# Patient Record
Sex: Male | Born: 1960 | Race: White | Hispanic: No | Smoking: Current every day smoker
Health system: Southern US, Community
[De-identification: ages and names within clinical notes are randomized; demographics above are authoritative.]

## PROBLEM LIST (undated history)

## (undated) DIAGNOSIS — E119 Type 2 diabetes mellitus without complications: Secondary | ICD-10-CM

## (undated) DIAGNOSIS — C8590 Non-Hodgkin lymphoma, unspecified, unspecified site: Secondary | ICD-10-CM

## (undated) DIAGNOSIS — I739 Peripheral vascular disease, unspecified: Secondary | ICD-10-CM

## (undated) DIAGNOSIS — I10 Essential (primary) hypertension: Secondary | ICD-10-CM

## (undated) HISTORY — PX: PORTACATH PLACEMENT: SHX2246

## (undated) HISTORY — PX: TOE SURGERY: SHX1073

## (undated) HISTORY — PX: COLONOSCOPY: SHX174

## (undated) HISTORY — PX: OTHER SURGICAL HISTORY: SHX169

## (undated) HISTORY — PX: TOTAL HIP ARTHROPLASTY: SHX124

---

## 1997-05-03 DIAGNOSIS — C8590 Non-Hodgkin lymphoma, unspecified, unspecified site: Secondary | ICD-10-CM

## 1997-05-03 HISTORY — DX: Non-Hodgkin lymphoma, unspecified, unspecified site: C85.90

## 2004-09-01 ENCOUNTER — Ambulatory Visit: Payer: Self-pay | Admitting: Hematology

## 2004-11-24 ENCOUNTER — Ambulatory Visit: Payer: Self-pay | Admitting: Hematology

## 2006-05-23 ENCOUNTER — Ambulatory Visit: Payer: Self-pay | Admitting: Oncology

## 2006-11-17 ENCOUNTER — Ambulatory Visit: Payer: Self-pay | Admitting: Oncology

## 2014-05-07 DIAGNOSIS — I10 Essential (primary) hypertension: Secondary | ICD-10-CM | POA: Diagnosis not present

## 2014-05-07 DIAGNOSIS — E1129 Type 2 diabetes mellitus with other diabetic kidney complication: Secondary | ICD-10-CM | POA: Diagnosis not present

## 2014-05-07 DIAGNOSIS — E1165 Type 2 diabetes mellitus with hyperglycemia: Secondary | ICD-10-CM | POA: Diagnosis not present

## 2014-05-07 DIAGNOSIS — Z6834 Body mass index (BMI) 34.0-34.9, adult: Secondary | ICD-10-CM | POA: Diagnosis not present

## 2014-06-06 DIAGNOSIS — E1165 Type 2 diabetes mellitus with hyperglycemia: Secondary | ICD-10-CM | POA: Diagnosis not present

## 2014-06-06 DIAGNOSIS — E1129 Type 2 diabetes mellitus with other diabetic kidney complication: Secondary | ICD-10-CM | POA: Diagnosis not present

## 2014-06-17 DIAGNOSIS — Z6833 Body mass index (BMI) 33.0-33.9, adult: Secondary | ICD-10-CM | POA: Diagnosis not present

## 2014-06-17 DIAGNOSIS — F17200 Nicotine dependence, unspecified, uncomplicated: Secondary | ICD-10-CM | POA: Diagnosis not present

## 2014-06-17 DIAGNOSIS — R220 Localized swelling, mass and lump, head: Secondary | ICD-10-CM | POA: Diagnosis not present

## 2014-06-17 DIAGNOSIS — Z8572 Personal history of non-Hodgkin lymphomas: Secondary | ICD-10-CM | POA: Diagnosis not present

## 2014-07-04 DIAGNOSIS — E782 Mixed hyperlipidemia: Secondary | ICD-10-CM | POA: Diagnosis not present

## 2014-07-04 DIAGNOSIS — I10 Essential (primary) hypertension: Secondary | ICD-10-CM | POA: Diagnosis not present

## 2014-07-04 DIAGNOSIS — E1129 Type 2 diabetes mellitus with other diabetic kidney complication: Secondary | ICD-10-CM | POA: Diagnosis not present

## 2014-07-05 DIAGNOSIS — D649 Anemia, unspecified: Secondary | ICD-10-CM | POA: Diagnosis not present

## 2014-07-05 DIAGNOSIS — C8585 Other specified types of non-Hodgkin lymphoma, lymph nodes of inguinal region and lower limb: Secondary | ICD-10-CM | POA: Diagnosis not present

## 2014-07-11 DIAGNOSIS — E1169 Type 2 diabetes mellitus with other specified complication: Secondary | ICD-10-CM | POA: Diagnosis not present

## 2014-07-11 DIAGNOSIS — E1365 Other specified diabetes mellitus with hyperglycemia: Secondary | ICD-10-CM | POA: Diagnosis not present

## 2014-07-11 DIAGNOSIS — E1329 Other specified diabetes mellitus with other diabetic kidney complication: Secondary | ICD-10-CM | POA: Diagnosis not present

## 2014-07-11 DIAGNOSIS — I10 Essential (primary) hypertension: Secondary | ICD-10-CM | POA: Diagnosis not present

## 2014-07-15 DIAGNOSIS — K08129 Complete loss of teeth due to periodontal diseases, unspecified class: Secondary | ICD-10-CM | POA: Diagnosis not present

## 2014-07-15 DIAGNOSIS — Z8572 Personal history of non-Hodgkin lymphomas: Secondary | ICD-10-CM | POA: Diagnosis not present

## 2014-07-15 DIAGNOSIS — K056 Periodontal disease, unspecified: Secondary | ICD-10-CM | POA: Diagnosis not present

## 2014-08-05 DIAGNOSIS — C8205 Follicular lymphoma grade I, lymph nodes of inguinal region and lower limb: Secondary | ICD-10-CM | POA: Diagnosis not present

## 2014-08-05 DIAGNOSIS — R911 Solitary pulmonary nodule: Secondary | ICD-10-CM | POA: Diagnosis not present

## 2014-11-05 DIAGNOSIS — E782 Mixed hyperlipidemia: Secondary | ICD-10-CM | POA: Diagnosis not present

## 2014-11-05 DIAGNOSIS — E1165 Type 2 diabetes mellitus with hyperglycemia: Secondary | ICD-10-CM | POA: Diagnosis not present

## 2014-11-05 DIAGNOSIS — I10 Essential (primary) hypertension: Secondary | ICD-10-CM | POA: Diagnosis not present

## 2014-11-05 DIAGNOSIS — E1129 Type 2 diabetes mellitus with other diabetic kidney complication: Secondary | ICD-10-CM | POA: Diagnosis not present

## 2014-11-18 DIAGNOSIS — I10 Essential (primary) hypertension: Secondary | ICD-10-CM | POA: Diagnosis not present

## 2014-11-18 DIAGNOSIS — E1159 Type 2 diabetes mellitus with other circulatory complications: Secondary | ICD-10-CM | POA: Diagnosis not present

## 2014-11-18 DIAGNOSIS — E1165 Type 2 diabetes mellitus with hyperglycemia: Secondary | ICD-10-CM | POA: Diagnosis not present

## 2014-11-18 DIAGNOSIS — E1129 Type 2 diabetes mellitus with other diabetic kidney complication: Secondary | ICD-10-CM | POA: Diagnosis not present

## 2014-12-03 DIAGNOSIS — F329 Major depressive disorder, single episode, unspecified: Secondary | ICD-10-CM | POA: Diagnosis not present

## 2014-12-03 DIAGNOSIS — E1159 Type 2 diabetes mellitus with other circulatory complications: Secondary | ICD-10-CM | POA: Diagnosis not present

## 2014-12-03 DIAGNOSIS — I10 Essential (primary) hypertension: Secondary | ICD-10-CM | POA: Diagnosis not present

## 2014-12-03 DIAGNOSIS — R41840 Attention and concentration deficit: Secondary | ICD-10-CM | POA: Diagnosis not present

## 2014-12-05 DIAGNOSIS — Z95820 Peripheral vascular angioplasty status with implants and grafts: Secondary | ICD-10-CM | POA: Diagnosis not present

## 2014-12-05 DIAGNOSIS — I70202 Unspecified atherosclerosis of native arteries of extremities, left leg: Secondary | ICD-10-CM | POA: Diagnosis not present

## 2014-12-05 DIAGNOSIS — I700 Atherosclerosis of aorta: Secondary | ICD-10-CM | POA: Diagnosis not present

## 2014-12-17 DIAGNOSIS — I10 Essential (primary) hypertension: Secondary | ICD-10-CM | POA: Diagnosis not present

## 2014-12-17 DIAGNOSIS — E1159 Type 2 diabetes mellitus with other circulatory complications: Secondary | ICD-10-CM | POA: Diagnosis not present

## 2014-12-17 DIAGNOSIS — Z6832 Body mass index (BMI) 32.0-32.9, adult: Secondary | ICD-10-CM | POA: Diagnosis not present

## 2014-12-31 DIAGNOSIS — I10 Essential (primary) hypertension: Secondary | ICD-10-CM | POA: Diagnosis not present

## 2014-12-31 DIAGNOSIS — E1159 Type 2 diabetes mellitus with other circulatory complications: Secondary | ICD-10-CM | POA: Diagnosis not present

## 2015-01-01 DIAGNOSIS — E1159 Type 2 diabetes mellitus with other circulatory complications: Secondary | ICD-10-CM | POA: Diagnosis not present

## 2015-01-07 DIAGNOSIS — F17210 Nicotine dependence, cigarettes, uncomplicated: Secondary | ICD-10-CM | POA: Diagnosis not present

## 2015-01-07 DIAGNOSIS — E1151 Type 2 diabetes mellitus with diabetic peripheral angiopathy without gangrene: Secondary | ICD-10-CM | POA: Diagnosis not present

## 2015-01-07 DIAGNOSIS — I70203 Unspecified atherosclerosis of native arteries of extremities, bilateral legs: Secondary | ICD-10-CM | POA: Diagnosis not present

## 2015-01-07 DIAGNOSIS — I10 Essential (primary) hypertension: Secondary | ICD-10-CM | POA: Diagnosis not present

## 2015-01-08 DIAGNOSIS — I10 Essential (primary) hypertension: Secondary | ICD-10-CM | POA: Diagnosis not present

## 2015-01-08 DIAGNOSIS — E1159 Type 2 diabetes mellitus with other circulatory complications: Secondary | ICD-10-CM | POA: Diagnosis not present

## 2015-01-08 DIAGNOSIS — E1165 Type 2 diabetes mellitus with hyperglycemia: Secondary | ICD-10-CM | POA: Diagnosis not present

## 2015-02-06 DIAGNOSIS — E1165 Type 2 diabetes mellitus with hyperglycemia: Secondary | ICD-10-CM | POA: Diagnosis not present

## 2015-02-06 DIAGNOSIS — E1129 Type 2 diabetes mellitus with other diabetic kidney complication: Secondary | ICD-10-CM | POA: Diagnosis not present

## 2015-02-17 DIAGNOSIS — E785 Hyperlipidemia, unspecified: Secondary | ICD-10-CM | POA: Diagnosis not present

## 2015-02-17 DIAGNOSIS — I10 Essential (primary) hypertension: Secondary | ICD-10-CM | POA: Diagnosis not present

## 2015-02-17 DIAGNOSIS — Z23 Encounter for immunization: Secondary | ICD-10-CM | POA: Diagnosis not present

## 2015-02-17 DIAGNOSIS — E1140 Type 2 diabetes mellitus with diabetic neuropathy, unspecified: Secondary | ICD-10-CM | POA: Diagnosis not present

## 2015-02-17 DIAGNOSIS — E1165 Type 2 diabetes mellitus with hyperglycemia: Secondary | ICD-10-CM | POA: Diagnosis not present

## 2015-03-10 DIAGNOSIS — E1129 Type 2 diabetes mellitus with other diabetic kidney complication: Secondary | ICD-10-CM | POA: Diagnosis not present

## 2015-03-10 DIAGNOSIS — E1165 Type 2 diabetes mellitus with hyperglycemia: Secondary | ICD-10-CM | POA: Diagnosis not present

## 2015-03-10 DIAGNOSIS — Z6835 Body mass index (BMI) 35.0-35.9, adult: Secondary | ICD-10-CM | POA: Diagnosis not present

## 2015-06-18 DIAGNOSIS — E1129 Type 2 diabetes mellitus with other diabetic kidney complication: Secondary | ICD-10-CM | POA: Diagnosis not present

## 2015-06-18 DIAGNOSIS — E1159 Type 2 diabetes mellitus with other circulatory complications: Secondary | ICD-10-CM | POA: Diagnosis not present

## 2015-06-18 DIAGNOSIS — E1169 Type 2 diabetes mellitus with other specified complication: Secondary | ICD-10-CM | POA: Diagnosis not present

## 2015-06-18 DIAGNOSIS — E1165 Type 2 diabetes mellitus with hyperglycemia: Secondary | ICD-10-CM | POA: Diagnosis not present

## 2015-06-23 DIAGNOSIS — E1165 Type 2 diabetes mellitus with hyperglycemia: Secondary | ICD-10-CM | POA: Diagnosis not present

## 2015-06-23 DIAGNOSIS — E782 Mixed hyperlipidemia: Secondary | ICD-10-CM | POA: Diagnosis not present

## 2015-06-23 DIAGNOSIS — E1129 Type 2 diabetes mellitus with other diabetic kidney complication: Secondary | ICD-10-CM | POA: Diagnosis not present

## 2015-06-23 DIAGNOSIS — E1159 Type 2 diabetes mellitus with other circulatory complications: Secondary | ICD-10-CM | POA: Diagnosis not present

## 2015-07-24 DIAGNOSIS — Z1389 Encounter for screening for other disorder: Secondary | ICD-10-CM | POA: Diagnosis not present

## 2015-07-24 DIAGNOSIS — Z139 Encounter for screening, unspecified: Secondary | ICD-10-CM | POA: Diagnosis not present

## 2015-07-24 DIAGNOSIS — Z0000 Encounter for general adult medical examination without abnormal findings: Secondary | ICD-10-CM | POA: Diagnosis not present

## 2015-07-24 DIAGNOSIS — Z9181 History of falling: Secondary | ICD-10-CM | POA: Diagnosis not present

## 2015-09-11 DIAGNOSIS — H3561 Retinal hemorrhage, right eye: Secondary | ICD-10-CM | POA: Diagnosis not present

## 2015-09-11 DIAGNOSIS — E119 Type 2 diabetes mellitus without complications: Secondary | ICD-10-CM | POA: Diagnosis not present

## 2015-09-23 DIAGNOSIS — E1159 Type 2 diabetes mellitus with other circulatory complications: Secondary | ICD-10-CM | POA: Diagnosis not present

## 2015-09-23 DIAGNOSIS — E1129 Type 2 diabetes mellitus with other diabetic kidney complication: Secondary | ICD-10-CM | POA: Diagnosis not present

## 2015-09-23 DIAGNOSIS — E1165 Type 2 diabetes mellitus with hyperglycemia: Secondary | ICD-10-CM | POA: Diagnosis not present

## 2015-09-23 DIAGNOSIS — E782 Mixed hyperlipidemia: Secondary | ICD-10-CM | POA: Diagnosis not present

## 2015-09-30 DIAGNOSIS — E782 Mixed hyperlipidemia: Secondary | ICD-10-CM | POA: Diagnosis not present

## 2015-09-30 DIAGNOSIS — E875 Hyperkalemia: Secondary | ICD-10-CM | POA: Diagnosis not present

## 2015-09-30 DIAGNOSIS — E1129 Type 2 diabetes mellitus with other diabetic kidney complication: Secondary | ICD-10-CM | POA: Diagnosis not present

## 2015-09-30 DIAGNOSIS — E1159 Type 2 diabetes mellitus with other circulatory complications: Secondary | ICD-10-CM | POA: Diagnosis not present

## 2015-09-30 DIAGNOSIS — E1165 Type 2 diabetes mellitus with hyperglycemia: Secondary | ICD-10-CM | POA: Diagnosis not present

## 2015-09-30 DIAGNOSIS — I10 Essential (primary) hypertension: Secondary | ICD-10-CM | POA: Diagnosis not present

## 2015-10-02 DIAGNOSIS — E113312 Type 2 diabetes mellitus with moderate nonproliferative diabetic retinopathy with macular edema, left eye: Secondary | ICD-10-CM | POA: Diagnosis not present

## 2015-10-18 DIAGNOSIS — I10 Essential (primary) hypertension: Secondary | ICD-10-CM | POA: Diagnosis not present

## 2015-10-18 DIAGNOSIS — R55 Syncope and collapse: Secondary | ICD-10-CM | POA: Diagnosis not present

## 2015-10-30 DIAGNOSIS — E113312 Type 2 diabetes mellitus with moderate nonproliferative diabetic retinopathy with macular edema, left eye: Secondary | ICD-10-CM | POA: Diagnosis not present

## 2015-12-02 DIAGNOSIS — E1140 Type 2 diabetes mellitus with diabetic neuropathy, unspecified: Secondary | ICD-10-CM | POA: Diagnosis not present

## 2015-12-02 DIAGNOSIS — M6281 Muscle weakness (generalized): Secondary | ICD-10-CM | POA: Diagnosis not present

## 2015-12-04 DIAGNOSIS — E113312 Type 2 diabetes mellitus with moderate nonproliferative diabetic retinopathy with macular edema, left eye: Secondary | ICD-10-CM | POA: Diagnosis not present

## 2015-12-12 DIAGNOSIS — E875 Hyperkalemia: Secondary | ICD-10-CM | POA: Diagnosis not present

## 2015-12-12 DIAGNOSIS — R002 Palpitations: Secondary | ICD-10-CM | POA: Diagnosis not present

## 2016-01-08 DIAGNOSIS — E113312 Type 2 diabetes mellitus with moderate nonproliferative diabetic retinopathy with macular edema, left eye: Secondary | ICD-10-CM | POA: Diagnosis not present

## 2016-01-21 DIAGNOSIS — K5909 Other constipation: Secondary | ICD-10-CM | POA: Diagnosis not present

## 2016-01-21 DIAGNOSIS — R141 Gas pain: Secondary | ICD-10-CM | POA: Diagnosis not present

## 2016-01-27 DIAGNOSIS — E1165 Type 2 diabetes mellitus with hyperglycemia: Secondary | ICD-10-CM | POA: Diagnosis not present

## 2016-01-27 DIAGNOSIS — E1159 Type 2 diabetes mellitus with other circulatory complications: Secondary | ICD-10-CM | POA: Diagnosis not present

## 2016-01-27 DIAGNOSIS — E782 Mixed hyperlipidemia: Secondary | ICD-10-CM | POA: Diagnosis not present

## 2016-01-27 DIAGNOSIS — E1129 Type 2 diabetes mellitus with other diabetic kidney complication: Secondary | ICD-10-CM | POA: Diagnosis not present

## 2016-02-02 DIAGNOSIS — E1159 Type 2 diabetes mellitus with other circulatory complications: Secondary | ICD-10-CM | POA: Diagnosis not present

## 2016-02-02 DIAGNOSIS — E782 Mixed hyperlipidemia: Secondary | ICD-10-CM | POA: Diagnosis not present

## 2016-02-02 DIAGNOSIS — E1165 Type 2 diabetes mellitus with hyperglycemia: Secondary | ICD-10-CM | POA: Diagnosis not present

## 2016-02-02 DIAGNOSIS — Z23 Encounter for immunization: Secondary | ICD-10-CM | POA: Diagnosis not present

## 2016-02-02 DIAGNOSIS — E1129 Type 2 diabetes mellitus with other diabetic kidney complication: Secondary | ICD-10-CM | POA: Diagnosis not present

## 2016-02-16 DIAGNOSIS — R1011 Right upper quadrant pain: Secondary | ICD-10-CM | POA: Diagnosis not present

## 2016-02-18 DIAGNOSIS — R1011 Right upper quadrant pain: Secondary | ICD-10-CM | POA: Diagnosis not present

## 2016-02-21 DIAGNOSIS — K859 Acute pancreatitis without necrosis or infection, unspecified: Secondary | ICD-10-CM | POA: Diagnosis not present

## 2016-02-21 DIAGNOSIS — R1013 Epigastric pain: Secondary | ICD-10-CM | POA: Diagnosis not present

## 2016-03-02 DIAGNOSIS — R109 Unspecified abdominal pain: Secondary | ICD-10-CM | POA: Diagnosis not present

## 2016-03-02 DIAGNOSIS — K869 Disease of pancreas, unspecified: Secondary | ICD-10-CM | POA: Diagnosis not present

## 2016-03-17 DIAGNOSIS — R109 Unspecified abdominal pain: Secondary | ICD-10-CM | POA: Diagnosis not present

## 2016-03-17 DIAGNOSIS — K869 Disease of pancreas, unspecified: Secondary | ICD-10-CM | POA: Diagnosis not present

## 2016-03-19 ENCOUNTER — Other Ambulatory Visit (HOSPITAL_COMMUNITY): Payer: Self-pay | Admitting: Family Medicine

## 2016-03-19 DIAGNOSIS — K8689 Other specified diseases of pancreas: Secondary | ICD-10-CM

## 2016-03-19 DIAGNOSIS — R109 Unspecified abdominal pain: Secondary | ICD-10-CM

## 2016-04-01 DIAGNOSIS — R109 Unspecified abdominal pain: Secondary | ICD-10-CM | POA: Diagnosis not present

## 2016-04-05 ENCOUNTER — Encounter (HOSPITAL_COMMUNITY): Payer: Self-pay | Admitting: *Deleted

## 2016-04-05 NOTE — Progress Notes (Signed)
Pt instructed to arrive at 0730. Pt instructed to remain NPO aftrer midnight except for sips with meds. Instructed to only take 70% of coverage at PM CBG check and to not take insulin or metformin the day of procedure. Pt verbalized understanding. Pt instructed that he will not be allowed to drive home.

## 2016-04-06 ENCOUNTER — Encounter (HOSPITAL_COMMUNITY): Payer: Self-pay | Admitting: *Deleted

## 2016-04-06 ENCOUNTER — Ambulatory Visit (HOSPITAL_COMMUNITY)
Admission: RE | Admit: 2016-04-06 | Discharge: 2016-04-06 | Disposition: A | Discharge status: Home / Self Care | Payer: Medicare Other | Source: Ambulatory Visit | Attending: Family Medicine | Admitting: Family Medicine

## 2016-04-06 ENCOUNTER — Encounter (HOSPITAL_COMMUNITY)
Admission: RE | Disposition: A | Discharge status: Home / Self Care | Payer: Self-pay | Source: Ambulatory Visit | Attending: Family Medicine

## 2016-04-06 ENCOUNTER — Ambulatory Visit (HOSPITAL_COMMUNITY): Payer: Medicare Other | Admitting: Certified Registered"

## 2016-04-06 DIAGNOSIS — R109 Unspecified abdominal pain: Secondary | ICD-10-CM | POA: Diagnosis not present

## 2016-04-06 DIAGNOSIS — E119 Type 2 diabetes mellitus without complications: Secondary | ICD-10-CM | POA: Diagnosis not present

## 2016-04-06 DIAGNOSIS — I10 Essential (primary) hypertension: Secondary | ICD-10-CM | POA: Diagnosis not present

## 2016-04-06 DIAGNOSIS — K8689 Other specified diseases of pancreas: Secondary | ICD-10-CM | POA: Diagnosis not present

## 2016-04-06 DIAGNOSIS — R935 Abnormal findings on diagnostic imaging of other abdominal regions, including retroperitoneum: Secondary | ICD-10-CM | POA: Diagnosis not present

## 2016-04-06 HISTORY — DX: Non-Hodgkin lymphoma, unspecified, unspecified site: C85.90

## 2016-04-06 HISTORY — DX: Essential (primary) hypertension: I10

## 2016-04-06 HISTORY — DX: Type 2 diabetes mellitus without complications: E11.9

## 2016-04-06 HISTORY — DX: Peripheral vascular disease, unspecified: I73.9

## 2016-04-06 HISTORY — PX: RADIOLOGY WITH ANESTHESIA: SHX6223

## 2016-04-06 LAB — BASIC METABOLIC PANEL
Anion gap: 10 (ref 5–15)
BUN: 10 mg/dL (ref 6–20)
CALCIUM: 9.8 mg/dL (ref 8.9–10.3)
CO2: 22 mmol/L (ref 22–32)
CREATININE: 0.94 mg/dL (ref 0.61–1.24)
Chloride: 103 mmol/L (ref 101–111)
GFR calc non Af Amer: >60 mL/min (ref >60)
Glucose, Bld: 159 mg/dL — ABNORMAL HIGH (ref 65–99)
Potassium: 4.6 mmol/L (ref 3.5–5.1)
SODIUM: 135 mmol/L (ref 135–145)

## 2016-04-06 LAB — GLUCOSE, CAPILLARY: Glucose-Capillary: 138 mg/dL — ABNORMAL HIGH (ref 65–99)

## 2016-04-06 LAB — HEMOGLOBIN: HEMOGLOBIN: 12.7 g/dL — AB (ref 13.0–17.0)

## 2016-04-06 SURGERY — RADIOLOGY WITH ANESTHESIA
Anesthesia: General

## 2016-04-06 MED ORDER — LACTATED RINGERS IV SOLN
INTRAVENOUS | Status: DC
  Administered 2016-04-06: 09:00:00 via INTRAVENOUS

## 2016-04-06 MED ORDER — GADOBENATE DIMEGLUMINE 529 MG/ML IV SOLN
15.0000 mL | Freq: Once | INTRAVENOUS | Status: AC
  Administered 2016-04-06: 15 mL via INTRAVENOUS

## 2016-04-06 MED ORDER — CARVEDILOL 3.125 MG PO TABS
ORAL_TABLET | ORAL | Status: AC
  Administered 2016-04-06: 6.25 mg via ORAL
  Filled 2016-04-06: qty 2

## 2016-04-06 MED ORDER — CARVEDILOL 6.25 MG PO TABS
6.2500 mg | ORAL_TABLET | Freq: Once | ORAL | Status: AC
  Administered 2016-04-06: 6.25 mg via ORAL
  Filled 2016-04-06: qty 1

## 2016-04-06 NOTE — Progress Notes (Signed)
Patient drank about 4oz of green tea with brown sugar this morning.  Dr. Ermalene Postin made aware

## 2016-04-06 NOTE — Transfer of Care (Signed)
Immediate Anesthesia Transfer of Kline Note  Patient: George Kline  Procedure(s) Performed: Procedure(s): MRI OF ABDOMIN WITH AND WITHOUT CONTRAST (N/A)  Patient Location: PACU  Anesthesia Type:General  Level of Consciousness: awake, alert , oriented and patient cooperative  Airway & Oxygen Therapy: Patient Spontanous Breathing  Post-op Assessment: Report given to RN and Post -op Vital signs reviewed and stable  Post vital signs: Reviewed and stable  Last Vitals:  Vitals:   04/06/16 0841 04/06/16 1123  BP: (!) 159/78   Pulse: 96   Resp: 20   Temp: 37.1 C (P) 36.4 C    Last Pain:  Vitals:   04/06/16 1123  TempSrc:   PainSc: (P) 0-No pain      Patients Stated Pain Goal: 3 ([id number])  Complications: No apparent anesthesia complications

## 2016-04-06 NOTE — Progress Notes (Signed)
   04/06/16 0830  OBSTRUCTIVE SLEEP APNEA  Have you ever been diagnosed with sleep apnea through a sleep study? No  Do you snore loudly (loud enough to be heard through closed doors)?  1  Do you often feel tired, fatigued, or sleepy during the daytime (such as falling asleep during driving or talking to someone)? 0  Has anyone observed you stop breathing during your sleep? 1  Do you have, or are you being treated for high blood pressure? 1  BMI more than 35 kg/m2? 0  Age > 50 (1-yes) 1  Neck circumference greater than:Male 16 inches or larger, Male 17inches or larger? 0  Male Gender (Yes=1) 1  Obstructive Sleep Apnea Score 5

## 2016-04-06 NOTE — Anesthesia Preprocedure Evaluation (Signed)
Anesthesia Evaluation  Patient identified by MRN, date of birth, ID band Patient awake    Reviewed: Allergy & Precautions, NPO status , Patient's Chart, lab work & pertinent test results, reviewed documented beta blocker date and time   History of Anesthesia Complications Negative for: history of anesthetic complications  Airway Mallampati: II  TM Distance: >3 FB     Dental  (+) Poor Dentition, Loose,    Pulmonary neg shortness of breath, neg COPD, neg recent URI, Current Smoker,    breath sounds clear to auscultation       Cardiovascular hypertension, Pt. on medications and Pt. on home beta blockers (-) angina+ Peripheral Vascular Disease   Rhythm:Regular     Neuro/Psych negative neurological ROS  negative psych ROS   GI/Hepatic negative GI ROS, Neg liver ROS,   Endo/Other  diabetes, Type 2, Insulin Dependent  Renal/GU negative Renal ROS     Musculoskeletal   Abdominal   Peds  Hematology negative hematology ROS (+)   Anesthesia Other Findings   Reproductive/Obstetrics                             Anesthesia Physical Anesthesia Plan  ASA: III  Anesthesia Plan: General   Post-op Pain Management:    Induction: Intravenous  Airway Management Planned: Oral ETT  Additional Equipment: None  Intra-op Plan:   Post-operative Plan: Extubation in OR  Informed Consent: I have reviewed the patients History and Physical, chart, labs and discussed the procedure including the risks, benefits and alternatives for the proposed anesthesia with the patient or authorized representative who has indicated his/her understanding and acceptance.   Dental advisory given  Plan Discussed with: CRNA and Surgeon  Anesthesia Plan Comments:         Anesthesia Quick Evaluation

## 2016-04-07 ENCOUNTER — Encounter (HOSPITAL_COMMUNITY): Payer: Self-pay | Admitting: Radiology

## 2016-04-07 DIAGNOSIS — D649 Anemia, unspecified: Secondary | ICD-10-CM

## 2016-04-07 DIAGNOSIS — R978 Other abnormal tumor markers: Secondary | ICD-10-CM | POA: Diagnosis not present

## 2016-04-07 DIAGNOSIS — R109 Unspecified abdominal pain: Secondary | ICD-10-CM | POA: Diagnosis not present

## 2016-04-07 DIAGNOSIS — K869 Disease of pancreas, unspecified: Secondary | ICD-10-CM | POA: Diagnosis not present

## 2016-04-07 DIAGNOSIS — R634 Abnormal weight loss: Secondary | ICD-10-CM | POA: Diagnosis not present

## 2016-04-07 DIAGNOSIS — C8205 Follicular lymphoma grade I, lymph nodes of inguinal region and lower limb: Secondary | ICD-10-CM | POA: Diagnosis not present

## 2016-04-07 NOTE — Anesthesia Postprocedure Evaluation (Signed)
Anesthesia Post Note  Patient: George Kline  Procedure(s) Performed: Procedure(s) (LRB): MRI OF ABDOMIN WITH AND WITHOUT CONTRAST (N/A)  Patient location during evaluation: PACU Anesthesia Type: General Level of consciousness: awake Pain management: pain level controlled Vital Signs Assessment: post-procedure vital signs reviewed and stable Respiratory status: spontaneous breathing Cardiovascular status: stable Postop Assessment: no signs of nausea or vomiting Anesthetic complications: no    Last Vitals:  Vitals:   04/06/16 1151 04/06/16 1153  BP: (!) 152/90   Pulse: 85   Resp: 17   Temp:  36.5 C    Last Pain:  Vitals:   04/06/16 1153  TempSrc:   PainSc: 0-No pain                 Fusaye Wachtel

## 2016-04-09 MED FILL — Midazolam HCl Inj 2 MG/2ML (Base Equivalent): INTRAMUSCULAR | Qty: 2 | Status: AC

## 2016-04-09 MED FILL — Propofol IV Emul 200 MG/20ML (10 MG/ML): INTRAVENOUS | Qty: 20 | Status: AC

## 2016-04-09 MED FILL — Sugammadex Sodium IV 200 MG/2ML (Base Equivalent): INTRAVENOUS | Qty: 2 | Status: AC

## 2016-04-09 MED FILL — Phenylephrine HCl Inj 10 MG/ML: INTRAMUSCULAR | Qty: 1 | Status: AC

## 2016-04-09 MED FILL — Lactated Ringer's Solution: INTRAVENOUS | Qty: 1000 | Status: AC

## 2016-04-09 MED FILL — Rocuronium Bromide IV Soln Pref Syr 100 MG/10ML (10 MG/ML): INTRAVENOUS | Qty: 10 | Status: AC

## 2016-04-09 MED FILL — Ondansetron HCl Inj 4 MG/2ML (2 MG/ML): INTRAMUSCULAR | Qty: 2 | Status: AC

## 2016-04-12 DIAGNOSIS — R109 Unspecified abdominal pain: Secondary | ICD-10-CM | POA: Diagnosis not present

## 2016-04-16 DIAGNOSIS — R935 Abnormal findings on diagnostic imaging of other abdominal regions, including retroperitoneum: Secondary | ICD-10-CM | POA: Diagnosis not present

## 2016-04-16 DIAGNOSIS — C8590 Non-Hodgkin lymphoma, unspecified, unspecified site: Secondary | ICD-10-CM | POA: Diagnosis not present

## 2016-04-16 DIAGNOSIS — R1013 Epigastric pain: Secondary | ICD-10-CM | POA: Diagnosis not present

## 2016-04-23 DIAGNOSIS — R109 Unspecified abdominal pain: Secondary | ICD-10-CM | POA: Diagnosis not present

## 2016-05-04 DIAGNOSIS — Z79899 Other long term (current) drug therapy: Secondary | ICD-10-CM | POA: Diagnosis not present

## 2016-05-04 DIAGNOSIS — G8929 Other chronic pain: Secondary | ICD-10-CM | POA: Diagnosis not present

## 2016-05-04 DIAGNOSIS — R1084 Generalized abdominal pain: Secondary | ICD-10-CM | POA: Diagnosis not present

## 2016-05-10 DIAGNOSIS — Z794 Long term (current) use of insulin: Secondary | ICD-10-CM | POA: Diagnosis not present

## 2016-05-10 DIAGNOSIS — C257 Malignant neoplasm of other parts of pancreas: Secondary | ICD-10-CM | POA: Diagnosis not present

## 2016-05-10 DIAGNOSIS — K869 Disease of pancreas, unspecified: Secondary | ICD-10-CM | POA: Diagnosis not present

## 2016-05-10 DIAGNOSIS — R933 Abnormal findings on diagnostic imaging of other parts of digestive tract: Secondary | ICD-10-CM | POA: Diagnosis not present

## 2016-05-10 DIAGNOSIS — C259 Malignant neoplasm of pancreas, unspecified: Secondary | ICD-10-CM | POA: Diagnosis not present

## 2016-05-10 DIAGNOSIS — E119 Type 2 diabetes mellitus without complications: Secondary | ICD-10-CM | POA: Diagnosis not present

## 2016-05-17 DIAGNOSIS — I10 Essential (primary) hypertension: Secondary | ICD-10-CM | POA: Diagnosis not present

## 2016-05-17 DIAGNOSIS — E1159 Type 2 diabetes mellitus with other circulatory complications: Secondary | ICD-10-CM | POA: Diagnosis not present

## 2016-05-17 DIAGNOSIS — E1129 Type 2 diabetes mellitus with other diabetic kidney complication: Secondary | ICD-10-CM | POA: Diagnosis not present

## 2016-05-17 DIAGNOSIS — E782 Mixed hyperlipidemia: Secondary | ICD-10-CM | POA: Diagnosis not present

## 2016-05-17 DIAGNOSIS — C259 Malignant neoplasm of pancreas, unspecified: Secondary | ICD-10-CM | POA: Diagnosis not present

## 2016-05-27 DIAGNOSIS — C8205 Follicular lymphoma grade I, lymph nodes of inguinal region and lower limb: Secondary | ICD-10-CM | POA: Diagnosis not present

## 2016-05-27 DIAGNOSIS — Z8572 Personal history of non-Hodgkin lymphomas: Secondary | ICD-10-CM | POA: Diagnosis not present

## 2016-05-27 DIAGNOSIS — R978 Other abnormal tumor markers: Secondary | ICD-10-CM | POA: Diagnosis not present

## 2016-05-27 DIAGNOSIS — C259 Malignant neoplasm of pancreas, unspecified: Secondary | ICD-10-CM | POA: Diagnosis not present

## 2016-05-28 DIAGNOSIS — C259 Malignant neoplasm of pancreas, unspecified: Secondary | ICD-10-CM | POA: Diagnosis not present

## 2016-05-31 DIAGNOSIS — Z8572 Personal history of non-Hodgkin lymphomas: Secondary | ICD-10-CM | POA: Diagnosis not present

## 2016-05-31 DIAGNOSIS — C258 Malignant neoplasm of overlapping sites of pancreas: Secondary | ICD-10-CM | POA: Diagnosis not present

## 2016-05-31 DIAGNOSIS — C8205 Follicular lymphoma grade I, lymph nodes of inguinal region and lower limb: Secondary | ICD-10-CM | POA: Diagnosis not present

## 2016-06-01 DIAGNOSIS — C259 Malignant neoplasm of pancreas, unspecified: Secondary | ICD-10-CM | POA: Diagnosis not present

## 2016-06-09 DIAGNOSIS — C8205 Follicular lymphoma grade I, lymph nodes of inguinal region and lower limb: Secondary | ICD-10-CM | POA: Diagnosis not present

## 2016-06-09 DIAGNOSIS — Z5111 Encounter for antineoplastic chemotherapy: Secondary | ICD-10-CM | POA: Diagnosis not present

## 2016-06-11 DIAGNOSIS — E1165 Type 2 diabetes mellitus with hyperglycemia: Secondary | ICD-10-CM | POA: Diagnosis not present

## 2016-06-11 DIAGNOSIS — E1129 Type 2 diabetes mellitus with other diabetic kidney complication: Secondary | ICD-10-CM | POA: Diagnosis not present

## 2016-06-11 DIAGNOSIS — E782 Mixed hyperlipidemia: Secondary | ICD-10-CM | POA: Diagnosis not present

## 2016-06-11 DIAGNOSIS — E1159 Type 2 diabetes mellitus with other circulatory complications: Secondary | ICD-10-CM | POA: Diagnosis not present

## 2016-06-16 DIAGNOSIS — C259 Malignant neoplasm of pancreas, unspecified: Secondary | ICD-10-CM | POA: Diagnosis not present

## 2016-06-16 DIAGNOSIS — C8205 Follicular lymphoma grade I, lymph nodes of inguinal region and lower limb: Secondary | ICD-10-CM | POA: Diagnosis not present

## 2016-06-23 DIAGNOSIS — C259 Malignant neoplasm of pancreas, unspecified: Secondary | ICD-10-CM | POA: Diagnosis not present

## 2016-06-23 DIAGNOSIS — C8205 Follicular lymphoma grade I, lymph nodes of inguinal region and lower limb: Secondary | ICD-10-CM | POA: Diagnosis not present

## 2016-06-30 DIAGNOSIS — C8205 Follicular lymphoma grade I, lymph nodes of inguinal region and lower limb: Secondary | ICD-10-CM | POA: Diagnosis not present

## 2016-07-08 DIAGNOSIS — C8205 Follicular lymphoma grade I, lymph nodes of inguinal region and lower limb: Secondary | ICD-10-CM | POA: Diagnosis not present

## 2016-07-08 DIAGNOSIS — C259 Malignant neoplasm of pancreas, unspecified: Secondary | ICD-10-CM | POA: Diagnosis not present

## 2016-07-08 DIAGNOSIS — K869 Disease of pancreas, unspecified: Secondary | ICD-10-CM | POA: Diagnosis not present

## 2016-07-15 DIAGNOSIS — Z5111 Encounter for antineoplastic chemotherapy: Secondary | ICD-10-CM | POA: Diagnosis not present

## 2016-07-15 DIAGNOSIS — C8205 Follicular lymphoma grade I, lymph nodes of inguinal region and lower limb: Secondary | ICD-10-CM | POA: Diagnosis not present

## 2016-07-22 DIAGNOSIS — C8205 Follicular lymphoma grade I, lymph nodes of inguinal region and lower limb: Secondary | ICD-10-CM | POA: Diagnosis not present

## 2016-07-22 DIAGNOSIS — Z510 Encounter for antineoplastic radiation therapy: Secondary | ICD-10-CM | POA: Diagnosis not present

## 2016-08-05 DIAGNOSIS — C8355 Lymphoblastic (diffuse) lymphoma, lymph nodes of inguinal region and lower limb: Secondary | ICD-10-CM | POA: Diagnosis not present

## 2016-08-05 DIAGNOSIS — Z8572 Personal history of non-Hodgkin lymphomas: Secondary | ICD-10-CM | POA: Diagnosis not present

## 2016-08-05 DIAGNOSIS — R197 Diarrhea, unspecified: Secondary | ICD-10-CM | POA: Diagnosis not present

## 2016-08-05 DIAGNOSIS — K5900 Constipation, unspecified: Secondary | ICD-10-CM | POA: Diagnosis not present

## 2016-08-05 DIAGNOSIS — R109 Unspecified abdominal pain: Secondary | ICD-10-CM | POA: Diagnosis not present

## 2016-08-05 DIAGNOSIS — R911 Solitary pulmonary nodule: Secondary | ICD-10-CM | POA: Diagnosis not present

## 2016-08-05 DIAGNOSIS — C259 Malignant neoplasm of pancreas, unspecified: Secondary | ICD-10-CM | POA: Diagnosis not present

## 2016-08-05 DIAGNOSIS — K869 Disease of pancreas, unspecified: Secondary | ICD-10-CM | POA: Diagnosis not present

## 2016-08-05 DIAGNOSIS — D649 Anemia, unspecified: Secondary | ICD-10-CM | POA: Diagnosis not present

## 2016-08-05 DIAGNOSIS — R110 Nausea: Secondary | ICD-10-CM | POA: Diagnosis not present

## 2016-08-05 DIAGNOSIS — E538 Deficiency of other specified B group vitamins: Secondary | ICD-10-CM | POA: Diagnosis not present

## 2016-08-12 DIAGNOSIS — C8205 Follicular lymphoma grade I, lymph nodes of inguinal region and lower limb: Secondary | ICD-10-CM | POA: Diagnosis not present

## 2016-08-12 DIAGNOSIS — Z510 Encounter for antineoplastic radiation therapy: Secondary | ICD-10-CM | POA: Diagnosis not present

## 2016-08-19 DIAGNOSIS — K869 Disease of pancreas, unspecified: Secondary | ICD-10-CM | POA: Diagnosis not present

## 2016-08-19 DIAGNOSIS — C8205 Follicular lymphoma grade I, lymph nodes of inguinal region and lower limb: Secondary | ICD-10-CM | POA: Diagnosis not present

## 2016-08-20 DIAGNOSIS — C251 Malignant neoplasm of body of pancreas: Secondary | ICD-10-CM | POA: Diagnosis not present

## 2016-08-20 DIAGNOSIS — D649 Anemia, unspecified: Secondary | ICD-10-CM | POA: Diagnosis not present

## 2016-08-23 DIAGNOSIS — C251 Malignant neoplasm of body of pancreas: Secondary | ICD-10-CM | POA: Diagnosis not present

## 2016-08-23 DIAGNOSIS — D649 Anemia, unspecified: Secondary | ICD-10-CM | POA: Diagnosis not present

## 2016-08-31 DIAGNOSIS — R531 Weakness: Secondary | ICD-10-CM | POA: Diagnosis not present

## 2016-08-31 DIAGNOSIS — C259 Malignant neoplasm of pancreas, unspecified: Secondary | ICD-10-CM | POA: Diagnosis not present

## 2016-08-31 DIAGNOSIS — R590 Localized enlarged lymph nodes: Secondary | ICD-10-CM | POA: Diagnosis not present

## 2016-08-31 DIAGNOSIS — I700 Atherosclerosis of aorta: Secondary | ICD-10-CM | POA: Diagnosis not present

## 2016-08-31 DIAGNOSIS — C251 Malignant neoplasm of body of pancreas: Secondary | ICD-10-CM | POA: Diagnosis not present

## 2016-08-31 DIAGNOSIS — H538 Other visual disturbances: Secondary | ICD-10-CM | POA: Diagnosis not present

## 2016-09-02 DIAGNOSIS — D473 Essential (hemorrhagic) thrombocythemia: Secondary | ICD-10-CM | POA: Diagnosis not present

## 2016-09-02 DIAGNOSIS — C258 Malignant neoplasm of overlapping sites of pancreas: Secondary | ICD-10-CM | POA: Diagnosis not present

## 2016-09-02 DIAGNOSIS — Z8572 Personal history of non-Hodgkin lymphomas: Secondary | ICD-10-CM | POA: Diagnosis not present

## 2016-09-02 DIAGNOSIS — C251 Malignant neoplasm of body of pancreas: Secondary | ICD-10-CM | POA: Diagnosis not present

## 2016-09-02 DIAGNOSIS — D649 Anemia, unspecified: Secondary | ICD-10-CM | POA: Diagnosis not present

## 2016-09-08 DIAGNOSIS — Z0000 Encounter for general adult medical examination without abnormal findings: Secondary | ICD-10-CM | POA: Diagnosis not present

## 2016-09-08 DIAGNOSIS — E1159 Type 2 diabetes mellitus with other circulatory complications: Secondary | ICD-10-CM | POA: Diagnosis not present

## 2016-09-08 DIAGNOSIS — Z7189 Other specified counseling: Secondary | ICD-10-CM | POA: Diagnosis not present

## 2016-09-08 DIAGNOSIS — Z139 Encounter for screening, unspecified: Secondary | ICD-10-CM | POA: Diagnosis not present

## 2016-09-08 DIAGNOSIS — E782 Mixed hyperlipidemia: Secondary | ICD-10-CM | POA: Diagnosis not present

## 2016-09-08 DIAGNOSIS — Z1389 Encounter for screening for other disorder: Secondary | ICD-10-CM | POA: Diagnosis not present

## 2016-09-08 DIAGNOSIS — E1129 Type 2 diabetes mellitus with other diabetic kidney complication: Secondary | ICD-10-CM | POA: Diagnosis not present

## 2016-09-08 DIAGNOSIS — E1165 Type 2 diabetes mellitus with hyperglycemia: Secondary | ICD-10-CM | POA: Diagnosis not present

## 2016-09-09 DIAGNOSIS — D649 Anemia, unspecified: Secondary | ICD-10-CM | POA: Diagnosis not present

## 2016-09-09 DIAGNOSIS — D473 Essential (hemorrhagic) thrombocythemia: Secondary | ICD-10-CM | POA: Diagnosis not present

## 2016-09-09 DIAGNOSIS — Z5111 Encounter for antineoplastic chemotherapy: Secondary | ICD-10-CM | POA: Diagnosis not present

## 2016-09-09 DIAGNOSIS — C251 Malignant neoplasm of body of pancreas: Secondary | ICD-10-CM | POA: Diagnosis not present

## 2016-09-14 DIAGNOSIS — C787 Secondary malignant neoplasm of liver and intrahepatic bile duct: Secondary | ICD-10-CM | POA: Diagnosis not present

## 2016-09-14 DIAGNOSIS — C259 Malignant neoplasm of pancreas, unspecified: Secondary | ICD-10-CM | POA: Diagnosis not present

## 2016-09-14 DIAGNOSIS — I10 Essential (primary) hypertension: Secondary | ICD-10-CM | POA: Diagnosis not present

## 2016-09-14 DIAGNOSIS — E1159 Type 2 diabetes mellitus with other circulatory complications: Secondary | ICD-10-CM | POA: Diagnosis not present

## 2016-09-16 DIAGNOSIS — C251 Malignant neoplasm of body of pancreas: Secondary | ICD-10-CM | POA: Diagnosis not present

## 2016-09-16 DIAGNOSIS — R52 Pain, unspecified: Secondary | ICD-10-CM | POA: Diagnosis not present

## 2016-09-30 DIAGNOSIS — C259 Malignant neoplasm of pancreas, unspecified: Secondary | ICD-10-CM | POA: Diagnosis not present

## 2016-09-30 DIAGNOSIS — C258 Malignant neoplasm of overlapping sites of pancreas: Secondary | ICD-10-CM | POA: Diagnosis not present

## 2016-10-04 DIAGNOSIS — Z8579 Personal history of other malignant neoplasms of lymphoid, hematopoietic and related tissues: Secondary | ICD-10-CM | POA: Diagnosis not present

## 2016-10-04 DIAGNOSIS — C258 Malignant neoplasm of overlapping sites of pancreas: Secondary | ICD-10-CM | POA: Diagnosis not present

## 2016-10-07 DIAGNOSIS — C8205 Follicular lymphoma grade I, lymph nodes of inguinal region and lower limb: Secondary | ICD-10-CM | POA: Diagnosis not present

## 2016-10-07 DIAGNOSIS — Z5111 Encounter for antineoplastic chemotherapy: Secondary | ICD-10-CM | POA: Diagnosis not present

## 2016-10-14 DIAGNOSIS — D649 Anemia, unspecified: Secondary | ICD-10-CM | POA: Diagnosis not present

## 2016-10-14 DIAGNOSIS — C251 Malignant neoplasm of body of pancreas: Secondary | ICD-10-CM | POA: Diagnosis not present

## 2016-10-15 DIAGNOSIS — C251 Malignant neoplasm of body of pancreas: Secondary | ICD-10-CM | POA: Diagnosis not present

## 2016-10-15 DIAGNOSIS — D649 Anemia, unspecified: Secondary | ICD-10-CM | POA: Diagnosis not present

## 2016-10-19 DIAGNOSIS — E1165 Type 2 diabetes mellitus with hyperglycemia: Secondary | ICD-10-CM | POA: Diagnosis not present

## 2016-10-19 DIAGNOSIS — E1129 Type 2 diabetes mellitus with other diabetic kidney complication: Secondary | ICD-10-CM | POA: Diagnosis not present

## 2016-10-19 DIAGNOSIS — C259 Malignant neoplasm of pancreas, unspecified: Secondary | ICD-10-CM | POA: Diagnosis not present

## 2016-10-19 DIAGNOSIS — C787 Secondary malignant neoplasm of liver and intrahepatic bile duct: Secondary | ICD-10-CM | POA: Diagnosis not present

## 2016-10-22 DIAGNOSIS — M79604 Pain in right leg: Secondary | ICD-10-CM | POA: Diagnosis not present

## 2016-10-22 DIAGNOSIS — M79661 Pain in right lower leg: Secondary | ICD-10-CM | POA: Diagnosis not present

## 2016-10-22 DIAGNOSIS — C251 Malignant neoplasm of body of pancreas: Secondary | ICD-10-CM | POA: Diagnosis not present

## 2016-10-28 DIAGNOSIS — C258 Malignant neoplasm of overlapping sites of pancreas: Secondary | ICD-10-CM | POA: Diagnosis not present

## 2016-10-28 DIAGNOSIS — Z8572 Personal history of non-Hodgkin lymphomas: Secondary | ICD-10-CM | POA: Diagnosis not present

## 2016-10-28 DIAGNOSIS — Z515 Encounter for palliative care: Secondary | ICD-10-CM | POA: Diagnosis not present

## 2016-11-01 DIAGNOSIS — C259 Malignant neoplasm of pancreas, unspecified: Secondary | ICD-10-CM | POA: Diagnosis not present

## 2016-11-01 DIAGNOSIS — R1010 Upper abdominal pain, unspecified: Secondary | ICD-10-CM | POA: Diagnosis not present

## 2016-11-01 DIAGNOSIS — C251 Malignant neoplasm of body of pancreas: Secondary | ICD-10-CM | POA: Diagnosis not present

## 2016-11-01 DIAGNOSIS — R978 Other abnormal tumor markers: Secondary | ICD-10-CM | POA: Diagnosis not present

## 2016-11-02 DIAGNOSIS — R300 Dysuria: Secondary | ICD-10-CM | POA: Diagnosis not present

## 2016-11-02 DIAGNOSIS — C258 Malignant neoplasm of overlapping sites of pancreas: Secondary | ICD-10-CM | POA: Diagnosis not present

## 2016-11-04 DIAGNOSIS — C258 Malignant neoplasm of overlapping sites of pancreas: Secondary | ICD-10-CM | POA: Diagnosis not present

## 2016-11-05 DIAGNOSIS — G893 Neoplasm related pain (acute) (chronic): Secondary | ICD-10-CM | POA: Diagnosis not present

## 2016-11-05 DIAGNOSIS — K5903 Drug induced constipation: Secondary | ICD-10-CM | POA: Diagnosis not present

## 2016-11-05 DIAGNOSIS — Z5181 Encounter for therapeutic drug level monitoring: Secondary | ICD-10-CM | POA: Diagnosis not present

## 2016-11-05 DIAGNOSIS — Z79899 Other long term (current) drug therapy: Secondary | ICD-10-CM | POA: Diagnosis not present

## 2016-11-05 DIAGNOSIS — R1010 Upper abdominal pain, unspecified: Secondary | ICD-10-CM | POA: Diagnosis not present

## 2016-11-09 DIAGNOSIS — C251 Malignant neoplasm of body of pancreas: Secondary | ICD-10-CM | POA: Diagnosis not present

## 2016-11-09 DIAGNOSIS — C258 Malignant neoplasm of overlapping sites of pancreas: Secondary | ICD-10-CM | POA: Diagnosis not present

## 2016-11-11 DIAGNOSIS — C258 Malignant neoplasm of overlapping sites of pancreas: Secondary | ICD-10-CM | POA: Diagnosis not present

## 2016-11-16 DIAGNOSIS — C8205 Follicular lymphoma grade I, lymph nodes of inguinal region and lower limb: Secondary | ICD-10-CM | POA: Diagnosis not present

## 2016-11-16 DIAGNOSIS — C258 Malignant neoplasm of overlapping sites of pancreas: Secondary | ICD-10-CM | POA: Diagnosis not present

## 2016-11-16 DIAGNOSIS — Z515 Encounter for palliative care: Secondary | ICD-10-CM | POA: Diagnosis not present

## 2016-11-17 DIAGNOSIS — E119 Type 2 diabetes mellitus without complications: Secondary | ICD-10-CM | POA: Diagnosis not present

## 2016-11-17 DIAGNOSIS — Z794 Long term (current) use of insulin: Secondary | ICD-10-CM | POA: Diagnosis not present

## 2016-11-17 DIAGNOSIS — Z8507 Personal history of malignant neoplasm of pancreas: Secondary | ICD-10-CM | POA: Diagnosis not present

## 2016-11-17 DIAGNOSIS — G893 Neoplasm related pain (acute) (chronic): Secondary | ICD-10-CM | POA: Diagnosis not present

## 2016-11-17 DIAGNOSIS — R109 Unspecified abdominal pain: Secondary | ICD-10-CM | POA: Diagnosis not present

## 2016-11-17 DIAGNOSIS — I10 Essential (primary) hypertension: Secondary | ICD-10-CM | POA: Diagnosis not present

## 2016-11-17 DIAGNOSIS — Z79899 Other long term (current) drug therapy: Secondary | ICD-10-CM | POA: Diagnosis not present

## 2016-11-18 DIAGNOSIS — C258 Malignant neoplasm of overlapping sites of pancreas: Secondary | ICD-10-CM | POA: Diagnosis not present

## 2016-12-01 DEATH — deceased

## 2017-12-17 IMAGING — MR MR ABDOMEN WO/W CM
10 of 20 series · 20 of 48 positions shown · IV contrast (multihance)
Comparison: 02/21/2016

CLINICAL DATA: Evaluate mass on pancreas.

EXAM:
MRI ABDOMEN WITHOUT AND WITH CONTRAST
TECHNIQUE: Multiplanar multisequence MR imaging of the abdomen was performed
both before and after the administration of intravenous contrast.
CONTRAST:  15mL MULTIHANCE GADOBENATE DIMEGLUMINE 529 MG/ML IV SOLN

[Series 4: cor ssfse nav · coronal · 6.0mm · 0.78mm/px · 2 of 37 slices shown]
[im 1/37]
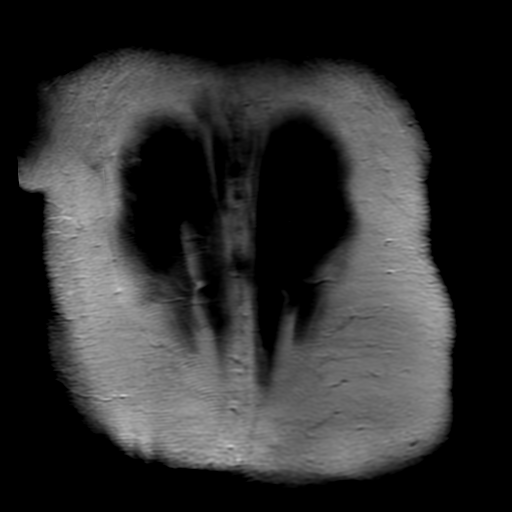
[im 37/37]
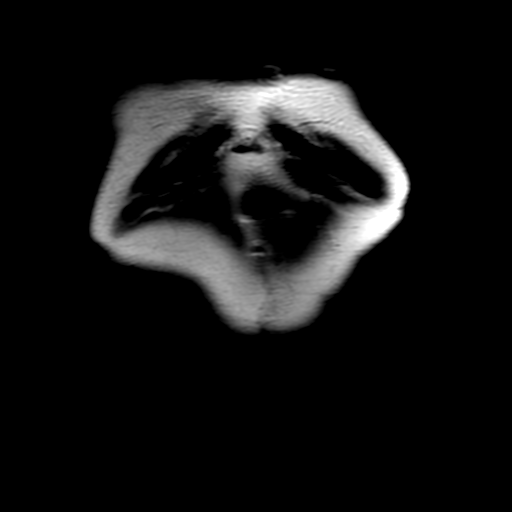

[Series 6: ax ssfse nav · axial · 6.0mm · 0.82mm/px · z∈[-47,+205]mm · 2 of 43 slices shown]
[im 1/43]
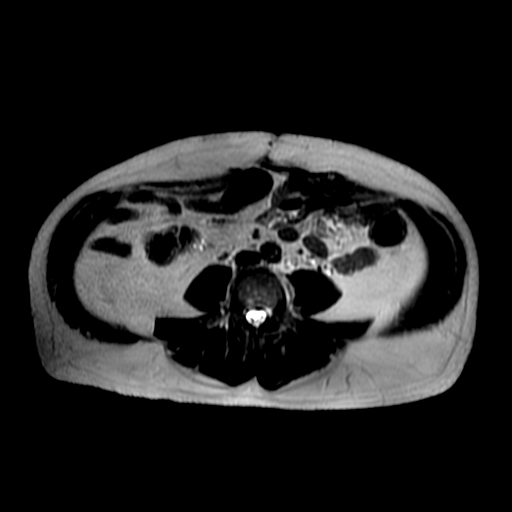
[im 43/43]
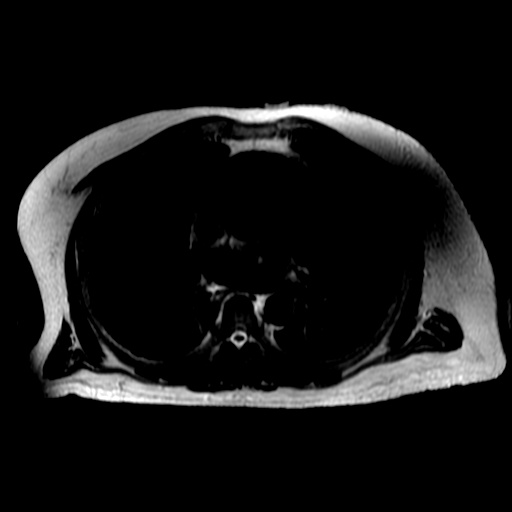

[Series 7: T2 fat-sat · axial · 6.0mm · 0.82mm/px · 1 of 43 slices shown]
[im 1/43]
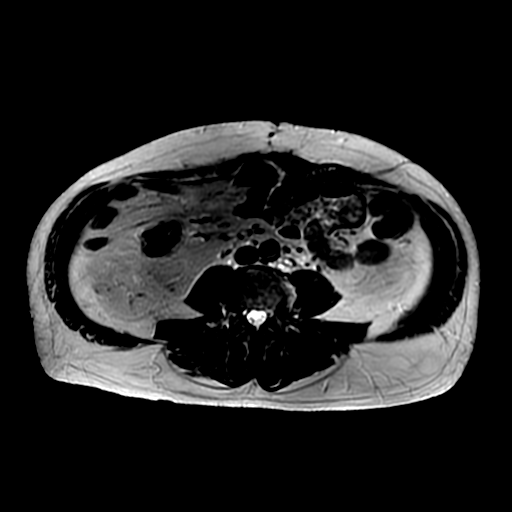

[Series 9: DWI b500 · axial · 8.0mm · 1.95mm/px · 1 of 52 slices shown]
[im 1/52]
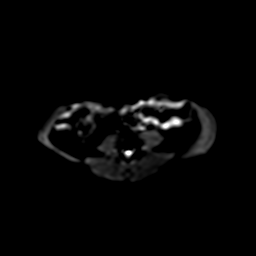

[Series 10: bSSFP · axial · 6.0mm · 0.82mm/px · 1 of 43 slices shown]
[im 1/43]
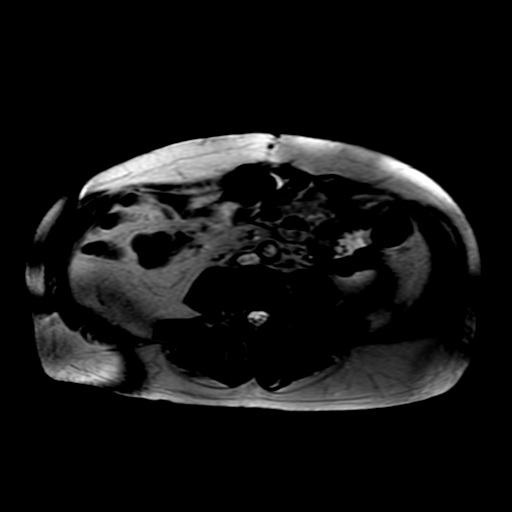

[Series 950: ADC · axial · 8.0mm · 1.95mm/px · 1 of 26 slices shown]
[im 1/26]
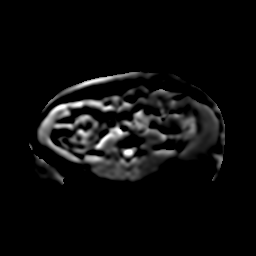

[Series 1201: T1 dynamic post-contrast · axial · non-contrast · 4.0mm · 0.82mm/px · z∈[-58,+204]mm · 3 of 132 slices shown (1 of 4)]
[im 1/132]
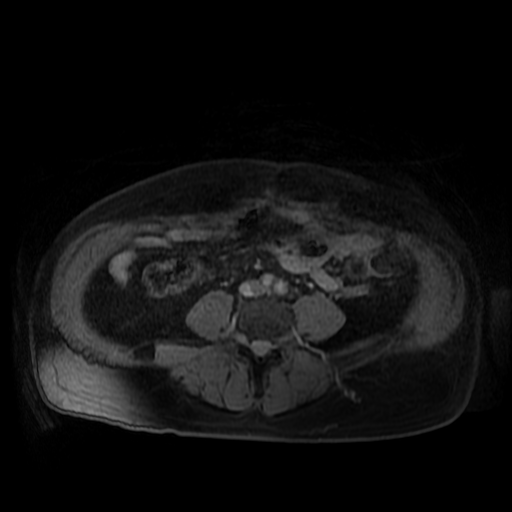
[im 66/132]
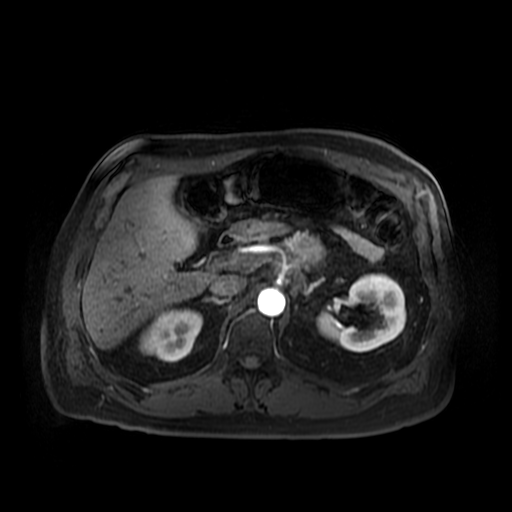
[im 132/132]
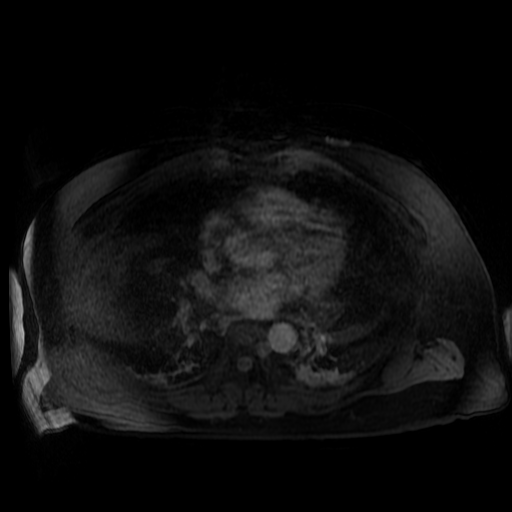

[Series 1202: T1 dynamic post-contrast · axial · non-contrast · 4.0mm · 0.82mm/px · z∈[-58,+204]mm · 3 of 132 slices shown (2 of 4)]
[im 1/132]
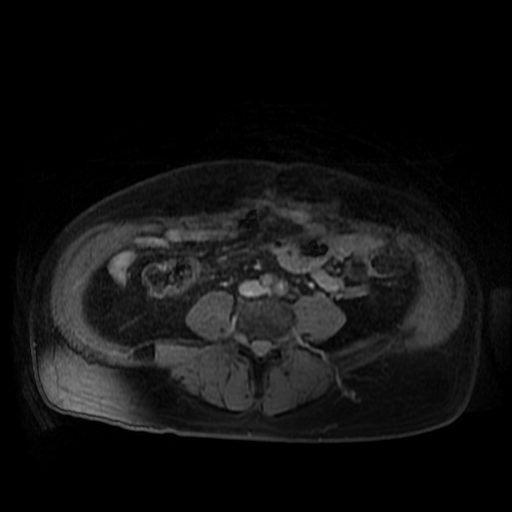
[im 66/132]
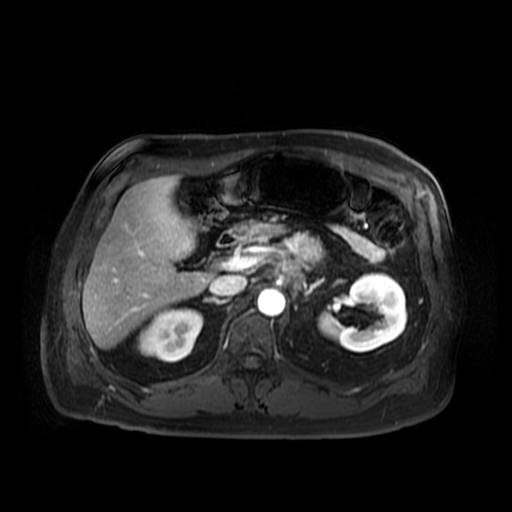
[im 132/132]
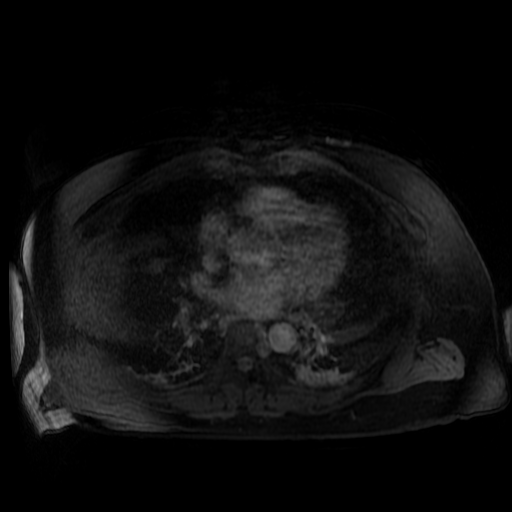

[Series 1203: T1 dynamic post-contrast · axial · non-contrast · 4.0mm · 0.82mm/px · z∈[-58,+204]mm · 3 of 132 slices shown (3 of 4)]
[im 1/132]
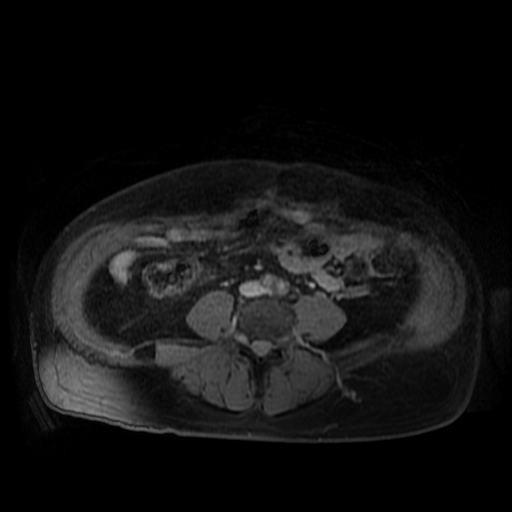
[im 66/132]
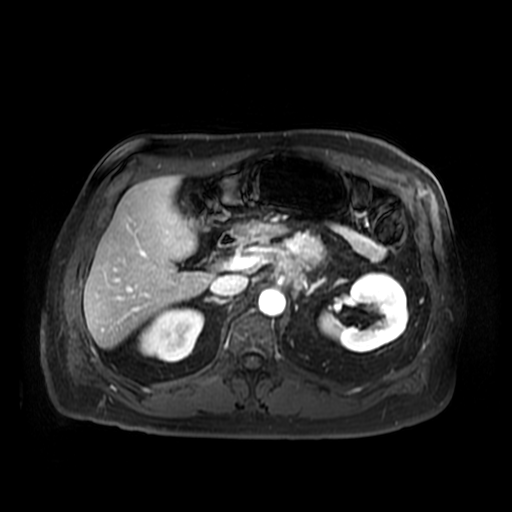
[im 132/132]
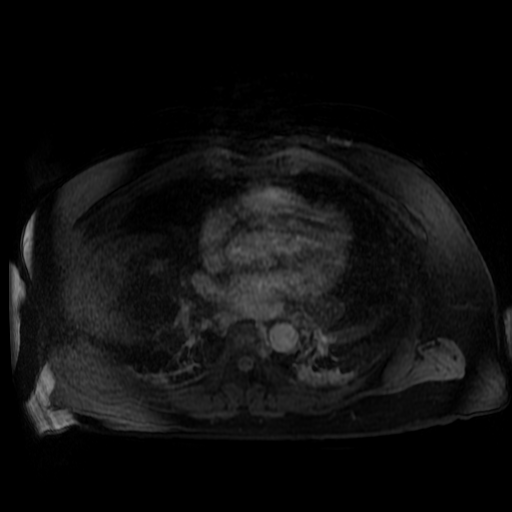

[Series 1204: T1 dynamic post-contrast · axial · non-contrast · 4.0mm · 0.82mm/px · z∈[-58,+204]mm · 3 of 132 slices shown (4 of 4)]
[im 1/132]
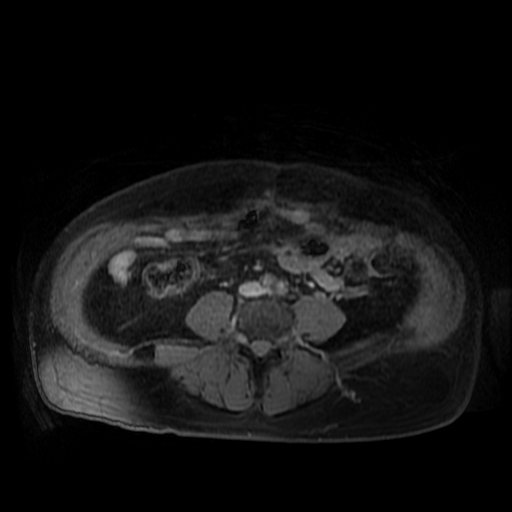
[im 66/132]
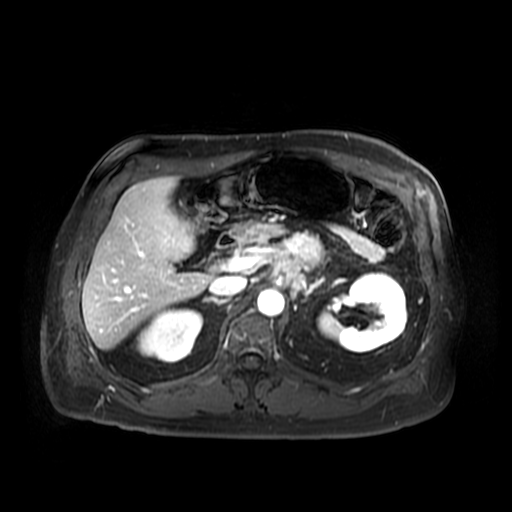
[im 132/132]
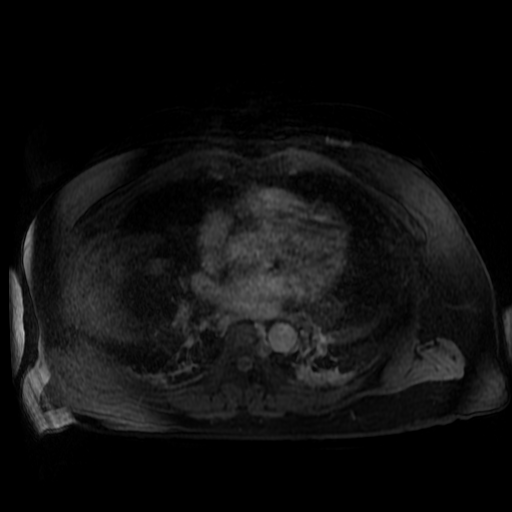

[20 of 48 positions shown; findings below may reference images not displayed]

FINDINGS: Lower chest: Atelectasis and/or airspace disease is noted within
both lower lobes posteriorly.

Hepatobiliary: No mass or other parenchymal abnormality identified.
The gallbladder is normal. No biliary dilatation.

Pancreas: There is relative abnormal decrease enhancement involving
the body and tail of pancreas, image 65 of series 2962. There is
abnormal increased soft tissue which extends from the undersurface
of body of pancreas and encases the celiac trunk and extends along
the proper hepatic artery. This measure 2.9 x 2.7 cm, image 65 of
series 9799 and 4. This does not appear to involve the portal vein
or superior mesenteric artery.

Spleen:  Within normal limits in size and appearance.

Adrenals/Urinary Tract: Normal appearance of the adrenal glands.
Simple appearing cyst arises from the posterior cortex of the left
kidney measuring 2.4 cm. No mass or hydronephrosis.

Stomach/Bowel: Visualized portions within the abdomen are
unremarkable.

Vascular/Lymphatic: Aortic atherosclerosis noted. No enlarged upper
abdominal lymph nodes.

Other:  None.

Musculoskeletal: No suspicious bone lesions identified.
IMPRESSION: 1. There is subtle abnormal decreased enhancement involving the body
and tail of pancreas, best appreciated on the arterial phase portion
of the exam. Additionally, there appears to be abnormal increased
soft tissue which extends into the retroperitoneal space and encases
the celiac trunk and proximal proper hepatic artery. In the absence
of signs or symptoms of pancreatitis findings are highly worrisome
for primary pancreatic adenocarcinoma. Consider further
investigation with ERCP and endoscopic ultrasound.
2. No focal liver lesions identified.
3. No metastatic adenopathy identified within the upper abdomen.
